# Patient Record
Sex: Male | Born: 1945 | Race: Black or African American | Hispanic: No | Marital: Married | Smoking: Never smoker
Health system: Southern US, Community
[De-identification: ages and names within clinical notes are randomized; demographics above are authoritative.]

## PROBLEM LIST (undated history)

## (undated) DIAGNOSIS — I10 Essential (primary) hypertension: Secondary | ICD-10-CM

## (undated) DIAGNOSIS — E119 Type 2 diabetes mellitus without complications: Secondary | ICD-10-CM

## (undated) DIAGNOSIS — M109 Gout, unspecified: Secondary | ICD-10-CM

---

## 2017-06-30 ENCOUNTER — Emergency Department (HOSPITAL_COMMUNITY)
Admission: EM | Admit: 2017-06-30 | Discharge: 2017-06-30 | Disposition: A | Discharge status: Home / Self Care | Payer: Medicare Other | Attending: Emergency Medicine | Admitting: Emergency Medicine

## 2017-06-30 ENCOUNTER — Encounter (HOSPITAL_COMMUNITY): Payer: Self-pay | Admitting: Emergency Medicine

## 2017-06-30 ENCOUNTER — Emergency Department (HOSPITAL_COMMUNITY): Payer: Medicare Other

## 2017-06-30 DIAGNOSIS — R0602 Shortness of breath: Secondary | ICD-10-CM | POA: Insufficient documentation

## 2017-06-30 DIAGNOSIS — I10 Essential (primary) hypertension: Secondary | ICD-10-CM | POA: Insufficient documentation

## 2017-06-30 DIAGNOSIS — E119 Type 2 diabetes mellitus without complications: Secondary | ICD-10-CM | POA: Diagnosis not present

## 2017-06-30 HISTORY — DX: Gout, unspecified: M10.9

## 2017-06-30 HISTORY — DX: Type 2 diabetes mellitus without complications: E11.9

## 2017-06-30 HISTORY — DX: Essential (primary) hypertension: I10

## 2017-06-30 LAB — I-STAT TROPONIN, ED: TROPONIN I, POC: 0 ng/mL (ref 0.00–0.08)

## 2017-06-30 LAB — CBC WITH DIFFERENTIAL/PLATELET
BASOS ABS: 0 10*3/uL (ref 0.0–0.1)
Basophils Relative: 0 %
EOS ABS: 0.1 10*3/uL (ref 0.0–0.7)
Eosinophils Relative: 1 %
HCT: 43.9 % (ref 39.0–52.0)
HEMOGLOBIN: 14.9 g/dL (ref 13.0–17.0)
LYMPHS ABS: 1.7 10*3/uL (ref 0.7–4.0)
LYMPHS PCT: 25 %
MCH: 30 pg (ref 26.0–34.0)
MCHC: 33.9 g/dL (ref 30.0–36.0)
MCV: 88.3 fL (ref 78.0–100.0)
Monocytes Absolute: 0.8 10*3/uL (ref 0.1–1.0)
Monocytes Relative: 12 %
NEUTROS PCT: 62 %
Neutro Abs: 4.1 10*3/uL (ref 1.7–7.7)
PLATELETS: 237 10*3/uL (ref 150–400)
RBC: 4.97 MIL/uL (ref 4.22–5.81)
RDW: 15 % (ref 11.5–15.5)
WBC: 6.6 10*3/uL (ref 4.0–10.5)

## 2017-06-30 LAB — BASIC METABOLIC PANEL
ANION GAP: 8 (ref 5–15)
BUN: 22 mg/dL — AB (ref 6–20)
CHLORIDE: 106 mmol/L (ref 101–111)
CO2: 26 mmol/L (ref 22–32)
Calcium: 9.4 mg/dL (ref 8.9–10.3)
Creatinine, Ser: 1.62 mg/dL — ABNORMAL HIGH (ref 0.61–1.24)
GFR, EST AFRICAN AMERICAN: 48 mL/min — AB (ref >60)
GFR, EST NON AFRICAN AMERICAN: 41 mL/min — AB (ref >60)
Glucose, Bld: 104 mg/dL — ABNORMAL HIGH (ref 65–99)
POTASSIUM: 3.7 mmol/L (ref 3.5–5.1)
SODIUM: 140 mmol/L (ref 135–145)

## 2017-06-30 LAB — BRAIN NATRIURETIC PEPTIDE: B NATRIURETIC PEPTIDE 5: 19 pg/mL (ref 0.0–100.0)

## 2017-06-30 NOTE — ED Notes (Signed)
Pt ambulated to restroom from room, tolerated well. 

## 2017-06-30 NOTE — Discharge Instructions (Signed)
Your lab tests today were overall normal.  Your kidney function was mildly elevated, recommend increase water intake and have this rechecked with your doctor. [facility]. Return here for any new/worsening symptoms.

## 2017-06-30 NOTE — ED Provider Notes (Signed)
[facility]-EMERGENCY DEPT Provider Note   CSN: [id number] Arrival date & time: 06/30/17  0609     History   Chief Complaint Chief Complaint  Patient presents with  . Shortness of Breath    HPI Mitchell Berger is a 71 y.o. male.  The history is provided by the patient and medical records.  Shortness of Breath     71 y.o. M with hx of DM, gout, HTN, presenting to the ED for SOB.  Patient and wife are from the [place], recently flew to [place] so their daughter could start back to college.  States he woke up 2 night ago with a coughing fit and was SOB but this resolved after about 20 minutes.  States he woke up again this morning with SOB that "came out of the blue".  He denies any new cough, fever, chest pain.  No position changes or exertional component.  No pain with deep inspiration.  No hx of DVT or PE.  Denies any leg pain or swelling.  No baseline respiratory issues such as asthma or COPD.  He has never been a smoker.  Past Medical History:  Diagnosis Date  . Diabetes mellitus without complication (HCC)    type 2  . Gout   . Hypertension     There are no active problems to display for this patient.   History reviewed. No pertinent surgical history.     Home Medications    Prior to Admission medications   Not on File    Family History History reviewed. No pertinent family history.  Social History Social History  Substance Use Topics  . Smoking status: Never Smoker  . Smokeless tobacco: Never Used  . Alcohol use Yes     Comment: ocassionally     Allergies   Patient has no known allergies.   Review of Systems Review of Systems  Respiratory: Positive for shortness of breath.   All other systems reviewed and are negative.    Physical Exam Updated Vital Signs BP (!) 140/92 (BP Location: Right Arm)   Pulse 79   Temp 98.6 F (37 C) (Oral)   Ht 6' (1.829 m)   Wt 120.2 kg (265 lb)   SpO2 98%   BMI 35.94 kg/m   Physical Exam  Constitutional: He  is oriented to person, place, and time. He appears well-developed and well-nourished.  HENT:  Head: Normocephalic and atraumatic.  Mouth/Throat: Oropharynx is clear and moist.  Eyes: Pupils are equal, round, and reactive to light. Conjunctivae and EOM are normal.  Neck: Normal range of motion.  Cardiovascular: Normal rate, regular rhythm and normal heart sounds.   Pulmonary/Chest: Effort normal and breath sounds normal. No respiratory distress. He has no wheezes.  No distress, lungs clear, no cough noted, able to speak in sentences without issue  Abdominal: Soft. Bowel sounds are normal.  Obese   Musculoskeletal: Normal range of motion.  Neurological: He is alert and oriented to person, place, and time.  Skin: Skin is warm and dry.  Psychiatric: He has a normal mood and affect.  Nursing note and vitals reviewed.    ED Treatments / Results  Labs (all labs ordered are listed, but only abnormal results are displayed) Labs Reviewed  BASIC METABOLIC PANEL - Abnormal; Notable for the following:       Result Value   Glucose, Bld 104 (*)    BUN 22 (*)    Creatinine, Ser 1.62 (*)    GFR calc non Af Amer 41 (*)  GFR calc Af Amer 48 (*)    All other components within normal limits  BRAIN NATRIURETIC PEPTIDE  CBC WITH DIFFERENTIAL/PLATELET  I-STAT TROPONIN, ED    EKG  EKG Interpretation  Date/Time:  Tuesday June 30 2017 06:15:41 EDT Ventricular Rate:  83 PR Interval:    QRS Duration: 114 QT Interval:  384 QTC Calculation: 452 R Axis:   -49 Text Interpretation:  Sinus rhythm Incomplete left bundle branch block Anterior Q waves, possibly due to ILBBB Confirmed by Geoffery LyonseLo, Douglas ([id number]) on 06/30/2017 6:41:37 AM       Radiology Dg Chest 2 View  Result Date: 06/30/2017 CLINICAL DATA:  Acute onset of shortness of breath. Initial encounter. EXAM: CHEST  2 VIEW COMPARISON:  None. FINDINGS: The lungs are well-aerated and clear. There is no evidence of focal opacification, pleural  effusion or pneumothorax. The heart is normal in size; the mediastinal contour is within normal limits. No acute osseous abnormalities are seen. IMPRESSION: No acute cardiopulmonary process seen. Electronically Signed   By: Roanna RaiderJeffery  Chang M.D.   On: 06/30/2017 06:46    Procedures Procedures (including critical care time)  Medications Ordered in ED Medications - No data to display   Initial Impression / Assessment and Plan / ED Course  I have reviewed the triage vital signs and the nursing notes.  Pertinent labs & imaging results that were available during my care of the patient were reviewed by me and considered in my medical decision making (see chart for details).  71 year old male with episodic shortness of breath.  Reports waking up in the middle of the night, coughing, and feeling short of breath. These episodes are transient. He has no associated chest pain. No pleuritic or exertional component noted. No known cardiac history. EKG here sinus rhythm without acute ischemic changes. Lab work including troponin overall reassuring. Serum creatinine 1.62, no prior for comparison. May be baseline.  Chest x-ray is clear. Patient does not appear clinically fluid overloaded. [facility] [place] Has been set up for sleep study in the near future for evaluation of sleep apnea. This may be contributing to his symptoms. Given his recent flight, considered PE but he has no tachycardia, hypoxia, pleuritic pain, calf tenderness or swelling, or other symptoms concerning for this. At this time, feel he is stable for discharge home. Will have him follow-up with his primary care doctor once returning home.   Patient seen and evaluated with attending physician, Dr. Jacqulyn BathLong, who agrees with assessment and plan of care.  Final Clinical Impressions(s) / ED Diagnoses   Final diagnoses:  SOB (shortness of breath)    New Prescriptions There are no discharge medications for this  patient.    Garlon HatchetSanders, Chestina Komatsu M, PA-C 06/30/17 [id number]    Maia PlanLong, Joshua G, MD 06/30/17 [phone]

## 2017-06-30 NOTE — ED Triage Notes (Signed)
Pt brought by EMS for c/o SOB, pt denies any pain, fever or nausea.

## 2018-11-05 IMAGING — DX DG CHEST 2V
2 series · 2 of 2 positions shown · non-contrast
Comparison: None.

CLINICAL DATA: Acute onset of shortness of breath. Initial
encounter.

EXAM:
CHEST  2 VIEW

[w chest pa]
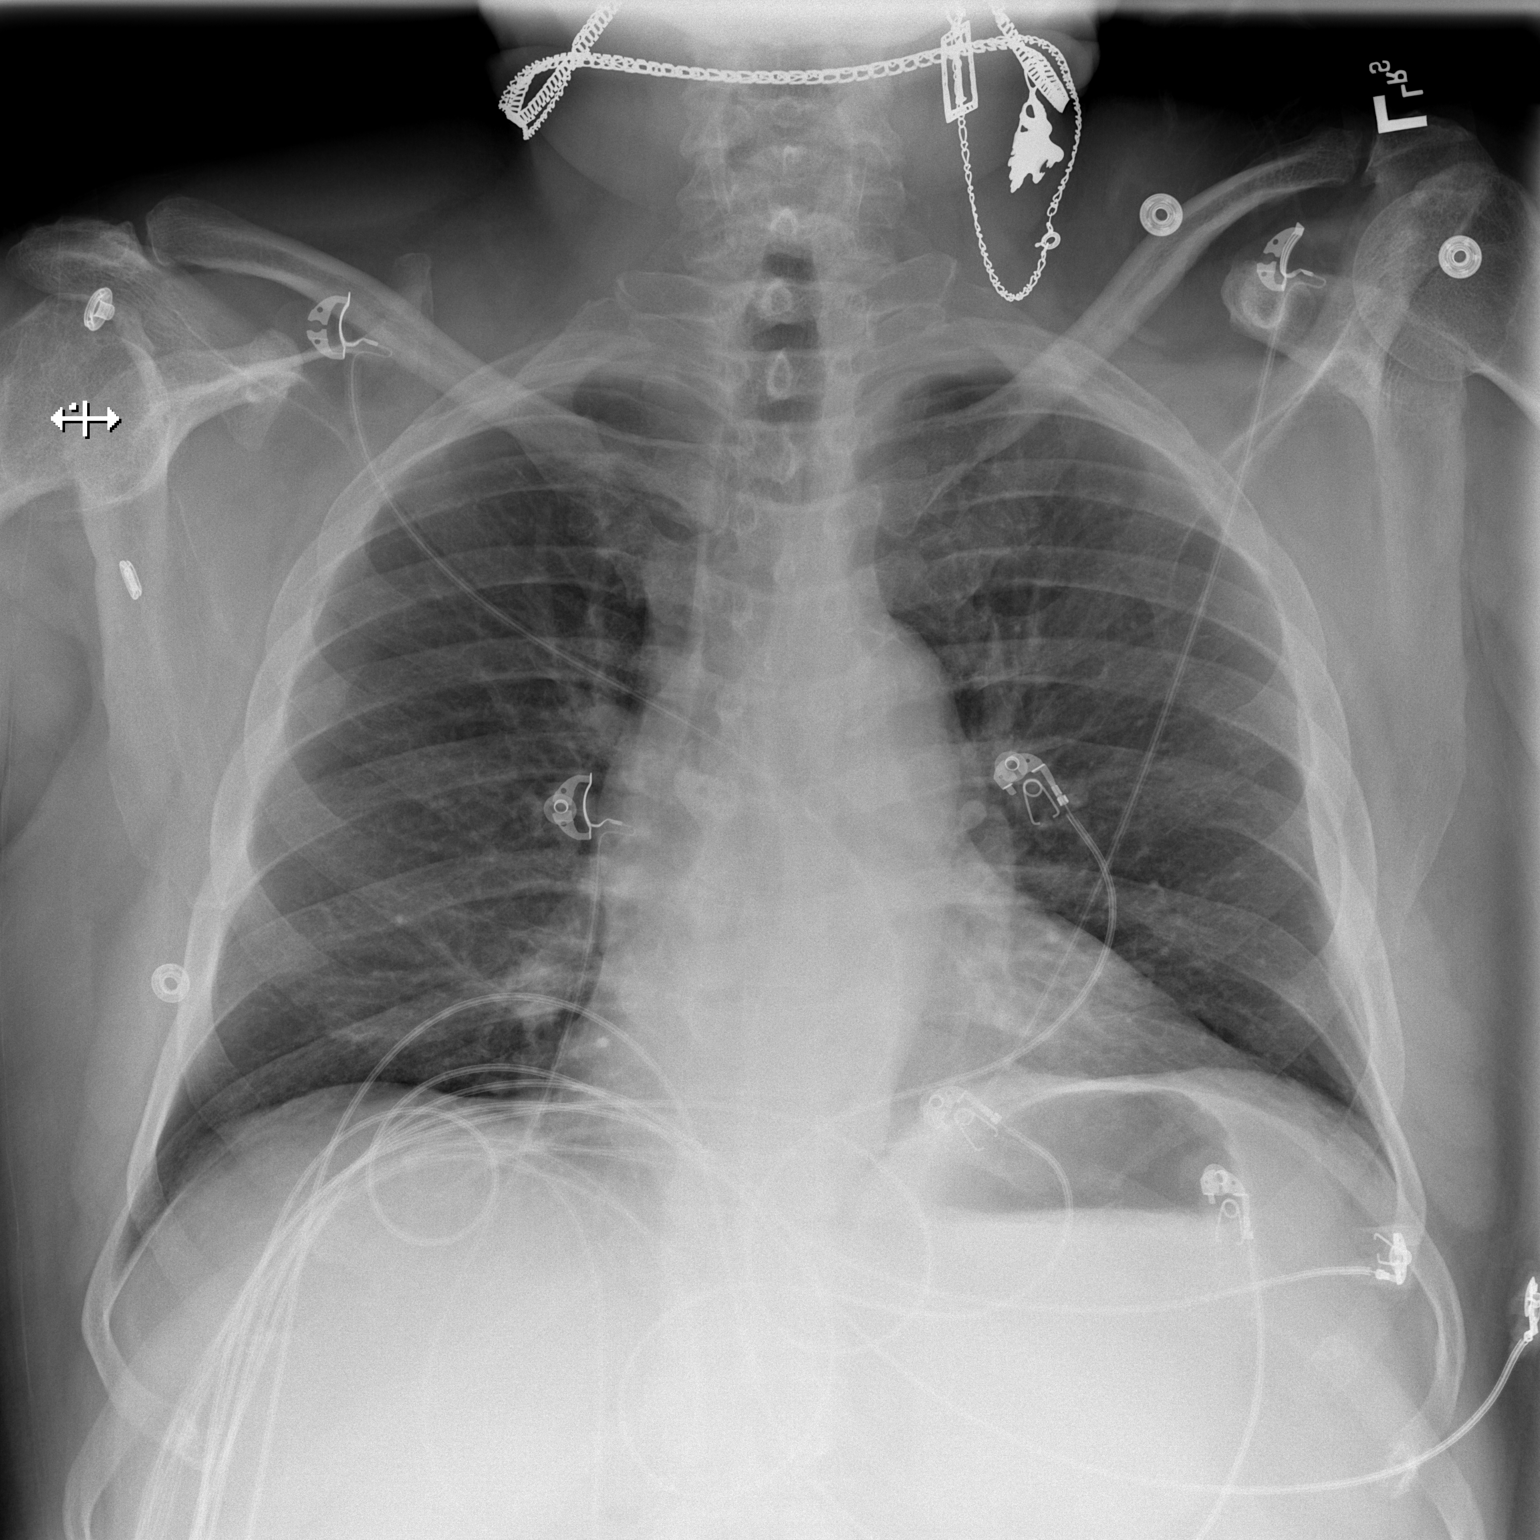

[w chest lat]
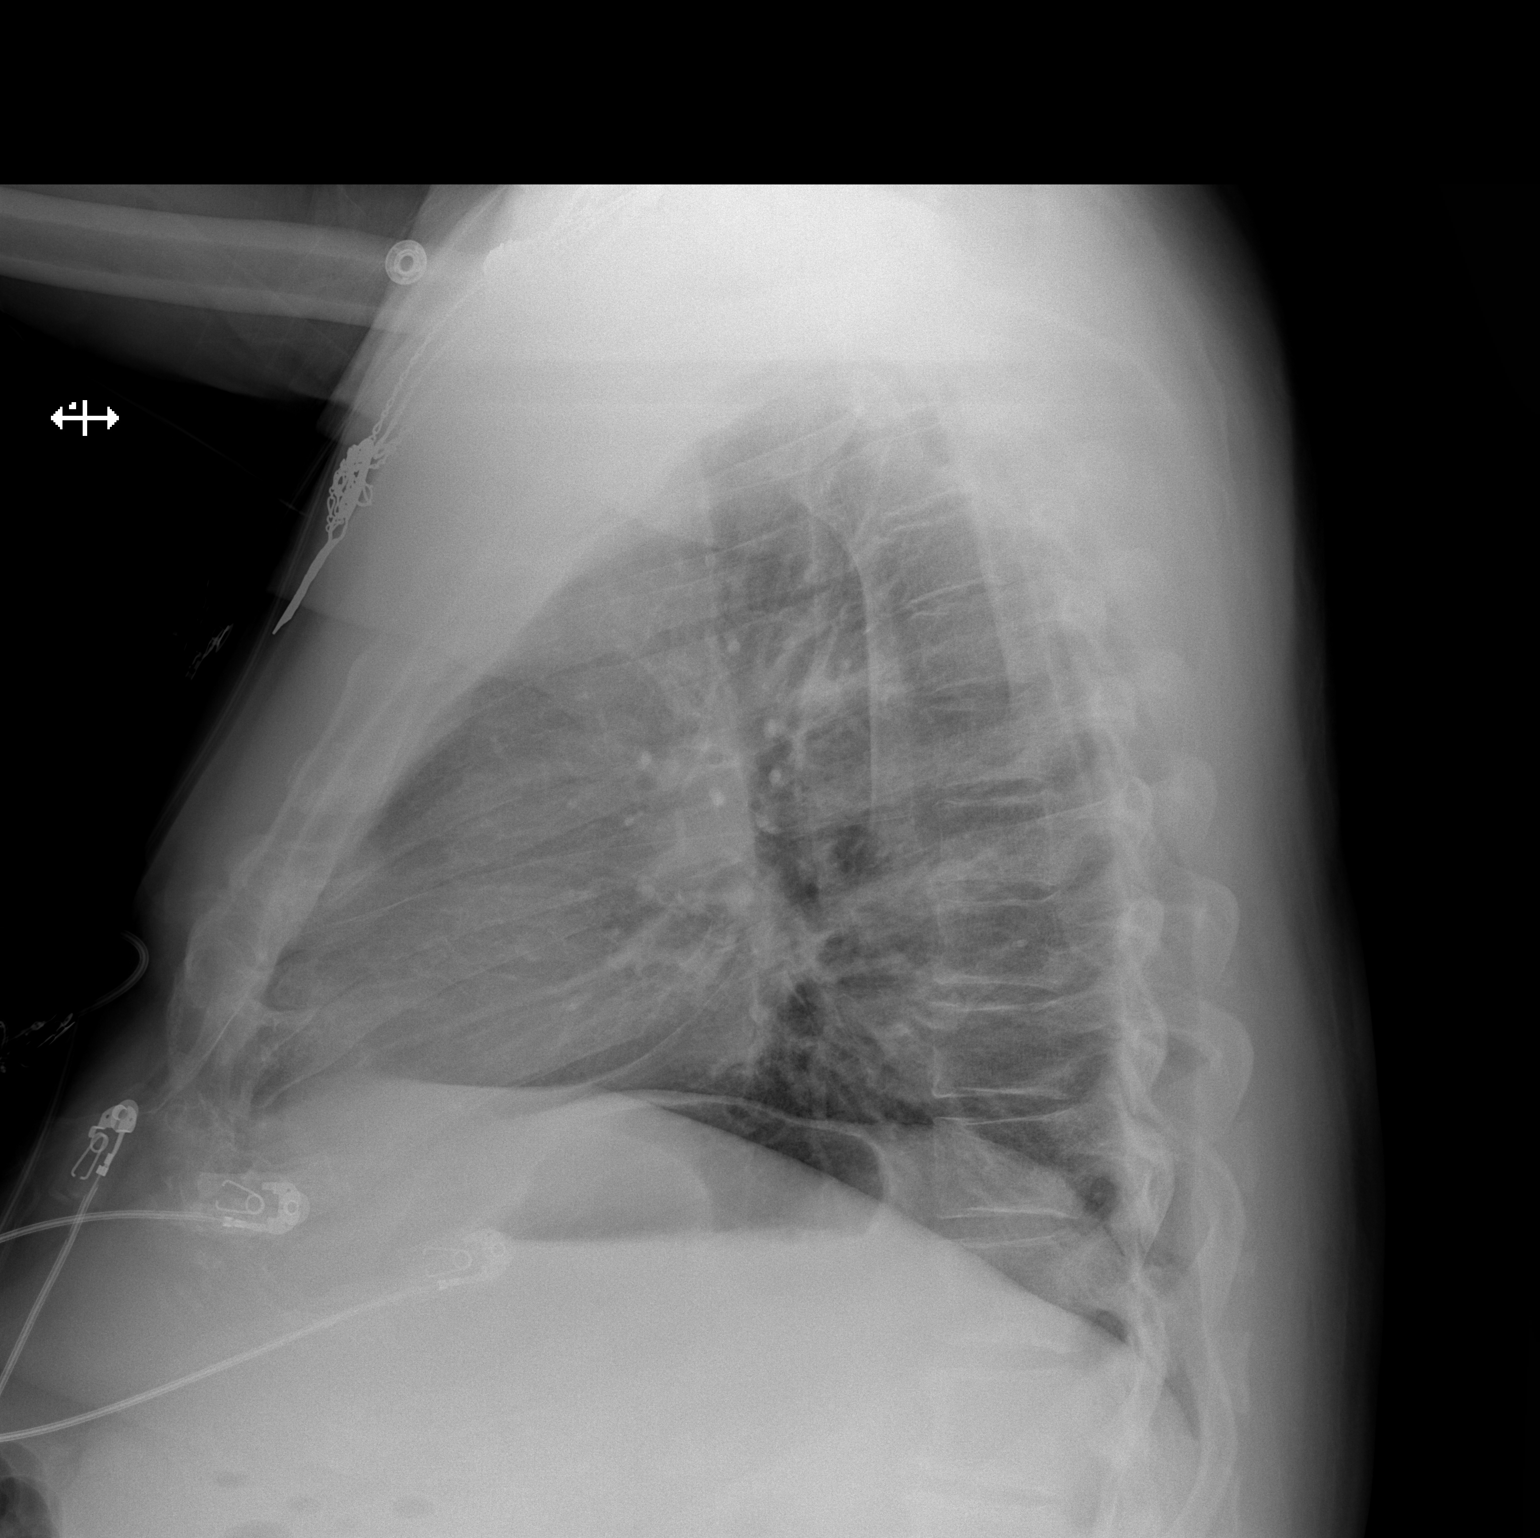

[2 of 2 positions shown; findings below may reference images not displayed]

FINDINGS: The lungs are well-aerated and clear. There is no evidence of focal
opacification, pleural effusion or pneumothorax.

The heart is normal in size; the mediastinal contour is within
normal limits. No acute osseous abnormalities are seen.
IMPRESSION: No acute cardiopulmonary process seen.
